# Patient Record
Sex: Male | Born: 1965 | Race: White | Hispanic: No | Marital: Married | Smoking: Never smoker
Health system: Southern US, Community
[De-identification: ages and names within clinical notes are randomized; demographics above are authoritative.]

---

## 2015-11-11 ENCOUNTER — Emergency Department (HOSPITAL_COMMUNITY): Payer: BLUE CROSS/BLUE SHIELD | Admitting: Anesthesiology

## 2015-11-11 ENCOUNTER — Encounter (HOSPITAL_COMMUNITY)
Admission: EM | Disposition: A | Discharge status: Home / Self Care | Payer: Self-pay | Source: Home / Self Care | Attending: Emergency Medicine

## 2015-11-11 ENCOUNTER — Encounter (HOSPITAL_COMMUNITY): Payer: Self-pay | Admitting: *Deleted

## 2015-11-11 ENCOUNTER — Observation Stay (HOSPITAL_COMMUNITY)
Admission: EM | Admit: 2015-11-11 | Discharge: 2015-11-12 | Disposition: A | Discharge status: Home / Self Care | Payer: BLUE CROSS/BLUE SHIELD | Attending: Surgery | Admitting: Surgery

## 2015-11-11 ENCOUNTER — Emergency Department (HOSPITAL_COMMUNITY): Payer: BLUE CROSS/BLUE SHIELD

## 2015-11-11 DIAGNOSIS — K353 Acute appendicitis with localized peritonitis, without perforation or gangrene: Secondary | ICD-10-CM

## 2015-11-11 DIAGNOSIS — K3580 Unspecified acute appendicitis: Secondary | ICD-10-CM | POA: Diagnosis present

## 2015-11-11 DIAGNOSIS — R109 Unspecified abdominal pain: Secondary | ICD-10-CM | POA: Diagnosis present

## 2015-11-11 HISTORY — PX: LAPAROSCOPIC APPENDECTOMY: SHX408

## 2015-11-11 LAB — URINALYSIS, ROUTINE W REFLEX MICROSCOPIC
BILIRUBIN URINE: NEGATIVE
Glucose, UA: NEGATIVE mg/dL
HGB URINE DIPSTICK: NEGATIVE
Ketones, ur: NEGATIVE mg/dL
Leukocytes, UA: NEGATIVE
Nitrite: NEGATIVE
PH: 5.5 (ref 5.0–8.0)
Protein, ur: NEGATIVE mg/dL
SPECIFIC GRAVITY, URINE: 1.024 (ref 1.005–1.030)

## 2015-11-11 LAB — CBC
HCT: 42.7 % (ref 39.0–52.0)
Hemoglobin: 14.3 g/dL (ref 13.0–17.0)
MCH: 28.9 pg (ref 26.0–34.0)
MCHC: 33.5 g/dL (ref 30.0–36.0)
MCV: 86.3 fL (ref 78.0–100.0)
Platelets: 241 10*3/uL (ref 150–400)
RBC: 4.95 MIL/uL (ref 4.22–5.81)
RDW: 13.9 % (ref 11.5–15.5)
WBC: 10 10*3/uL (ref 4.0–10.5)

## 2015-11-11 LAB — COMPREHENSIVE METABOLIC PANEL
ALBUMIN: 4.2 g/dL (ref 3.5–5.0)
ALT: 17 U/L (ref 17–63)
ANION GAP: 8 (ref 5–15)
AST: 17 U/L (ref 15–41)
Alkaline Phosphatase: 41 U/L (ref 38–126)
BUN: 19 mg/dL (ref 6–20)
CO2: 28 mmol/L (ref 22–32)
CREATININE: 0.9 mg/dL (ref 0.61–1.24)
Calcium: 9.6 mg/dL (ref 8.9–10.3)
Chloride: 104 mmol/L (ref 101–111)
GFR calc Af Amer: >60 mL/min (ref >60)
GFR calc non Af Amer: >60 mL/min (ref >60)
GLUCOSE: 111 mg/dL — AB (ref 65–99)
Potassium: 4 mmol/L (ref 3.5–5.1)
Sodium: 140 mmol/L (ref 135–145)
TOTAL PROTEIN: 7.1 g/dL (ref 6.5–8.1)
Total Bilirubin: 0.7 mg/dL (ref 0.3–1.2)

## 2015-11-11 LAB — LIPASE, BLOOD: Lipase: 31 U/L (ref 11–51)

## 2015-11-11 SURGERY — APPENDECTOMY, LAPAROSCOPIC
Anesthesia: General | Site: Abdomen

## 2015-11-11 MED ORDER — MIDAZOLAM HCL 5 MG/5ML IJ SOLN
INTRAMUSCULAR | Status: DC | PRN
  Administered 2015-11-11: 2 mg via INTRAVENOUS

## 2015-11-11 MED ORDER — DEXTROSE 5 % IV SOLN: 2.0000 g | INTRAVENOUS | Status: DC

## 2015-11-11 MED ORDER — DIPHENHYDRAMINE HCL 50 MG/ML IJ SOLN: 25.0000 mg | Freq: Four times a day (QID) | INTRAMUSCULAR | Status: DC | PRN

## 2015-11-11 MED ORDER — ROCURONIUM BROMIDE 100 MG/10ML IV SOLN
INTRAVENOUS | Status: DC | PRN
  Administered 2015-11-11: 35 mg via INTRAVENOUS

## 2015-11-11 MED ORDER — ACETAMINOPHEN 650 MG RE SUPP: 650.0000 mg | Freq: Four times a day (QID) | RECTAL | Status: DC | PRN

## 2015-11-11 MED ORDER — DEXAMETHASONE SODIUM PHOSPHATE 10 MG/ML IJ SOLN
INTRAMUSCULAR | Status: DC | PRN
  Administered 2015-11-11: 10 mg via INTRAVENOUS

## 2015-11-11 MED ORDER — MIDAZOLAM HCL 2 MG/2ML IJ SOLN
INTRAMUSCULAR | Status: AC
  Filled 2015-11-11: qty 2

## 2015-11-11 MED ORDER — MEPERIDINE HCL 50 MG/ML IJ SOLN: 6.2500 mg | INTRAMUSCULAR | Status: DC | PRN

## 2015-11-11 MED ORDER — HYDROMORPHONE HCL 1 MG/ML IJ SOLN: 1.0000 mg | INTRAMUSCULAR | Status: DC | PRN

## 2015-11-11 MED ORDER — ONDANSETRON HCL 4 MG/2ML IJ SOLN: 4.0000 mg | Freq: Four times a day (QID) | INTRAMUSCULAR | Status: DC | PRN

## 2015-11-11 MED ORDER — LIDOCAINE HCL (CARDIAC) 20 MG/ML IV SOLN
INTRAVENOUS | Status: DC | PRN
  Administered 2015-11-11: 80 mg via INTRAVENOUS

## 2015-11-11 MED ORDER — LACTATED RINGERS IV SOLN
INTRAVENOUS | Status: DC | PRN
  Administered 2015-11-11 (×2): via INTRAVENOUS

## 2015-11-11 MED ORDER — PROPOFOL 10 MG/ML IV BOLUS
INTRAVENOUS | Status: DC | PRN
  Administered 2015-11-11: 190 mg via INTRAVENOUS

## 2015-11-11 MED ORDER — ONDANSETRON 4 MG PO TBDP: 4.0000 mg | ORAL_TABLET | Freq: Four times a day (QID) | ORAL | Status: DC | PRN

## 2015-11-11 MED ORDER — LACTATED RINGERS IR SOLN
Status: DC | PRN
  Administered 2015-11-11: 1000 mL

## 2015-11-11 MED ORDER — BUPIVACAINE-EPINEPHRINE 0.25% -1:200000 IJ SOLN
INTRAMUSCULAR | Status: DC | PRN
  Administered 2015-11-11: 20 mL

## 2015-11-11 MED ORDER — SUGAMMADEX SODIUM 200 MG/2ML IV SOLN
INTRAVENOUS | Status: DC | PRN
  Administered 2015-11-11: 200 mg via INTRAVENOUS

## 2015-11-11 MED ORDER — HYDROCODONE-ACETAMINOPHEN 5-325 MG PO TABS: 1.0000 | ORAL_TABLET | ORAL | Status: DC | PRN

## 2015-11-11 MED ORDER — HYDROMORPHONE HCL 1 MG/ML IJ SOLN
INTRAMUSCULAR | Status: AC
  Filled 2015-11-11: qty 1

## 2015-11-11 MED ORDER — FENTANYL CITRATE (PF) 100 MCG/2ML IJ SOLN
INTRAMUSCULAR | Status: DC | PRN
  Administered 2015-11-11 (×2): 50 ug via INTRAVENOUS
  Administered 2015-11-11: 150 ug via INTRAVENOUS

## 2015-11-11 MED ORDER — ACETAMINOPHEN 325 MG PO TABS: 650.0000 mg | ORAL_TABLET | Freq: Four times a day (QID) | ORAL | Status: DC | PRN

## 2015-11-11 MED ORDER — MORPHINE SULFATE (PF) 2 MG/ML IV SOLN: 2.0000 mg | INTRAVENOUS | Status: DC | PRN

## 2015-11-11 MED ORDER — PROPOFOL 10 MG/ML IV BOLUS
INTRAVENOUS | Status: AC
  Filled 2015-11-11: qty 20

## 2015-11-11 MED ORDER — BUPIVACAINE-EPINEPHRINE (PF) 0.25% -1:200000 IJ SOLN
INTRAMUSCULAR | Status: AC
  Filled 2015-11-11: qty 30

## 2015-11-11 MED ORDER — DEXAMETHASONE SODIUM PHOSPHATE 10 MG/ML IJ SOLN
INTRAMUSCULAR | Status: AC
  Filled 2015-11-11: qty 1

## 2015-11-11 MED ORDER — 0.9 % SODIUM CHLORIDE (POUR BTL) OPTIME
TOPICAL | Status: DC | PRN
  Administered 2015-11-11: 1000 mL

## 2015-11-11 MED ORDER — GLYCOPYRROLATE 0.2 MG/ML IJ SOLN
INTRAMUSCULAR | Status: DC | PRN
  Administered 2015-11-11: 0.2 mg via INTRAVENOUS

## 2015-11-11 MED ORDER — CEFOXITIN SODIUM 2 G IV SOLR
2.0000 g | Freq: Once | INTRAVENOUS | Status: AC
  Administered 2015-11-11: 2 g via INTRAVENOUS
  Filled 2015-11-11: qty 2

## 2015-11-11 MED ORDER — LACTATED RINGERS IV SOLN: INTRAVENOUS | Status: DC

## 2015-11-11 MED ORDER — IOHEXOL 300 MG/ML  SOLN
25.0000 mL | Freq: Once | INTRAMUSCULAR | Status: AC | PRN
  Administered 2015-11-11: 25 mL via ORAL

## 2015-11-11 MED ORDER — SUGAMMADEX SODIUM 200 MG/2ML IV SOLN
INTRAVENOUS | Status: AC
  Filled 2015-11-11: qty 2

## 2015-11-11 MED ORDER — ONDANSETRON HCL 4 MG/2ML IJ SOLN
4.0000 mg | Freq: Four times a day (QID) | INTRAMUSCULAR | Status: DC | PRN
Start: 2015-11-11 — End: 2015-11-12

## 2015-11-11 MED ORDER — POTASSIUM CHLORIDE IN NACL 20-0.9 MEQ/L-% IV SOLN
INTRAVENOUS | Status: DC
  Filled 2015-11-11 (×2): qty 1000

## 2015-11-11 MED ORDER — DIPHENHYDRAMINE HCL 25 MG PO CAPS: 25.0000 mg | ORAL_CAPSULE | Freq: Four times a day (QID) | ORAL | Status: DC | PRN

## 2015-11-11 MED ORDER — HYDROMORPHONE HCL 1 MG/ML IJ SOLN
0.2500 mg | INTRAMUSCULAR | Status: DC | PRN
  Administered 2015-11-11: 0.5 mg via INTRAVENOUS

## 2015-11-11 MED ORDER — DEXTROSE 5 % IV SOLN
1.0000 g | Freq: Three times a day (TID) | INTRAVENOUS | Status: DC
  Administered 2015-11-11 – 2015-11-12 (×2): 1 g via INTRAVENOUS
  Filled 2015-11-11 (×4): qty 1

## 2015-11-11 MED ORDER — KCL IN DEXTROSE-NACL 20-5-0.45 MEQ/L-%-% IV SOLN
INTRAVENOUS | Status: DC
  Filled 2015-11-11 (×2): qty 1000

## 2015-11-11 MED ORDER — FENTANYL CITRATE (PF) 250 MCG/5ML IJ SOLN
INTRAMUSCULAR | Status: AC
  Filled 2015-11-11: qty 5

## 2015-11-11 MED ORDER — PROMETHAZINE HCL 25 MG/ML IJ SOLN: 6.2500 mg | INTRAMUSCULAR | Status: DC | PRN

## 2015-11-11 MED ORDER — SODIUM CHLORIDE 0.9 % IV BOLUS (SEPSIS)
500.0000 mL | Freq: Once | INTRAVENOUS | Status: AC
  Administered 2015-11-11: 500 mL via INTRAVENOUS

## 2015-11-11 MED ORDER — ONDANSETRON HCL 4 MG/2ML IJ SOLN
INTRAMUSCULAR | Status: DC | PRN
  Administered 2015-11-11: 4 mg via INTRAVENOUS

## 2015-11-11 MED ORDER — EPHEDRINE SULFATE 50 MG/ML IJ SOLN
INTRAMUSCULAR | Status: DC | PRN
  Administered 2015-11-11: 10 mg via INTRAVENOUS

## 2015-11-11 MED ORDER — ONDANSETRON HCL 4 MG/2ML IJ SOLN
INTRAMUSCULAR | Status: AC
  Filled 2015-11-11: qty 2

## 2015-11-11 MED ORDER — SUCCINYLCHOLINE CHLORIDE 20 MG/ML IJ SOLN
INTRAMUSCULAR | Status: DC | PRN
  Administered 2015-11-11: 100 mg via INTRAVENOUS

## 2015-11-11 MED ORDER — METRONIDAZOLE IN NACL 5-0.79 MG/ML-% IV SOLN
500.0000 mg | Freq: Three times a day (TID) | INTRAVENOUS | Status: DC
Start: 2015-11-11 — End: 2015-11-12
  Administered 2015-11-11 – 2015-11-12 (×2): 500 mg via INTRAVENOUS
  Filled 2015-11-11 (×3): qty 100

## 2015-11-11 MED ORDER — IOPAMIDOL (ISOVUE-300) INJECTION 61%
100.0000 mL | Freq: Once | INTRAVENOUS | Status: AC | PRN
  Administered 2015-11-11: 100 mL via INTRAVENOUS

## 2015-11-11 SURGICAL SUPPLY — 30 items
APPLIER CLIP ROT 10 11.4 M/L (STAPLE)
BENZOIN TINCTURE PRP APPL 2/3 (GAUZE/BANDAGES/DRESSINGS) ×3 IMPLANT
CLIP APPLIE ROT 10 11.4 M/L (STAPLE) IMPLANT
CLOSURE WOUND 1/2 X4 (GAUZE/BANDAGES/DRESSINGS) ×1
COVER SURGICAL LIGHT HANDLE (MISCELLANEOUS) ×3 IMPLANT
CUTTER FLEX LINEAR 45M (STAPLE) ×3 IMPLANT
DECANTER SPIKE VIAL GLASS SM (MISCELLANEOUS) IMPLANT
DRAPE LAPAROSCOPIC ABDOMINAL (DRAPES) ×3 IMPLANT
ELECT REM PT RETURN 9FT ADLT (ELECTROSURGICAL) ×3
ELECTRODE REM PT RTRN 9FT ADLT (ELECTROSURGICAL) ×1 IMPLANT
ENDOLOOP SUT PDS II  0 18 (SUTURE)
ENDOLOOP SUT PDS II 0 18 (SUTURE) IMPLANT
GLOVE SURG ORTHO 8.0 STRL STRW (GLOVE) ×3 IMPLANT
GOWN STRL REUS W/TWL XL LVL3 (GOWN DISPOSABLE) ×6 IMPLANT
KIT BASIN OR (CUSTOM PROCEDURE TRAY) ×3 IMPLANT
POUCH SPECIMEN RETRIEVAL 10MM (ENDOMECHANICALS) ×3 IMPLANT
RELOAD 45 VASCULAR/THIN (ENDOMECHANICALS) IMPLANT
RELOAD STAPLE TA45 3.5 REG BLU (ENDOMECHANICALS) ×3 IMPLANT
SET IRRIG TUBING LAPAROSCOPIC (IRRIGATION / IRRIGATOR) ×3 IMPLANT
SHEARS HARMONIC ACE PLUS 36CM (ENDOMECHANICALS) ×3 IMPLANT
STRIP CLOSURE SKIN 1/2X4 (GAUZE/BANDAGES/DRESSINGS) ×2 IMPLANT
SUT MNCRL AB 4-0 PS2 18 (SUTURE) ×3 IMPLANT
TOWEL OR 17X26 10 PK STRL BLUE (TOWEL DISPOSABLE) ×3 IMPLANT
TOWEL OR NON WOVEN STRL DISP B (DISPOSABLE) ×3 IMPLANT
TRAY FOLEY W/METER SILVER 14FR (SET/KITS/TRAYS/PACK) ×3 IMPLANT
TRAY FOLEY W/METER SILVER 16FR (SET/KITS/TRAYS/PACK) ×3 IMPLANT
TRAY LAPAROSCOPIC (CUSTOM PROCEDURE TRAY) ×3 IMPLANT
TROCAR BLADELESS OPT 5 75 (ENDOMECHANICALS) ×3 IMPLANT
TROCAR XCEL BLUNT TIP 100MML (ENDOMECHANICALS) ×3 IMPLANT
TROCAR XCEL NON-BLD 11X100MML (ENDOMECHANICALS) ×3 IMPLANT

## 2015-11-11 NOTE — ED Notes (Signed)
Pt states that he began having RLQ abd pain that began Saturday; pt states that the pain has gotten progressively worse sine Sat and that the pain woke him from sleep this am; pt states intermittent nausea, denies nausea currently; pt denies vomiting and diarrhea

## 2015-11-11 NOTE — Discharge Instructions (Signed)
Laparoscopic Appendectomy, Adult, Care After °Refer to this sheet in the next few weeks. These instructions provide you with information on caring for yourself after your procedure. Your caregiver may also give you more specific instructions. Your treatment has been planned according to current medical practices, but problems sometimes occur. Call your caregiver if you have any problems or questions after your procedure. °HOME CARE INSTRUCTIONS °· Do not drive while taking narcotic pain medicines. °· Use stool softener if you become constipated from your pain medicines. °· Change your bandages (dressings) as directed. °· Keep your wounds clean and dry. You may wash the wounds gently with soap and water. Gently pat the wounds dry with a clean towel. °· Do not take baths, swim, or use hot tubs for 10 days, or as instructed by your caregiver. °· Only take over-the-counter or prescription medicines for pain, discomfort, or fever as directed by your caregiver. °· You may continue your normal diet as directed. °· Do not lift more than 10 pounds (4.5 kg) or play contact sports for 3 weeks, or as directed. °· Slowly increase your activity after surgery. °· Take deep breaths to avoid getting a lung infection (pneumonia). °SEEK MEDICAL CARE IF: °· You have redness, swelling, or increasing pain in your wounds. °· You have pus coming from your wounds. °· You have drainage from a wound that lasts longer than 1 day. °· You notice a bad smell coming from the wounds or dressing. °· Your wound edges break open after stitches (sutures) have been removed. °· You notice increasing pain in the shoulders (shoulder strap areas) or near your shoulder blades. °· You develop dizzy episodes or fainting while standing. °· You develop shortness of breath. °· You develop persistent nausea or vomiting. °· You cannot control your bowel functions or lose your appetite. °· You develop diarrhea. °SEEK IMMEDIATE MEDICAL CARE IF:  °· You have a  fever. °· You develop a rash. °· You have difficulty breathing or sharp pains in your chest. °· You develop any reaction or side effects to medicines given. °MAKE SURE YOU: °· Understand these instructions. °· Will watch your condition. °· Will get help right away if you are not doing well or get worse. °  °This information is not intended to replace advice given to you by your health care provider. Make sure you discuss any questions you have with your health care provider. °  °Document Released: 08/09/2005 Document Revised: 12/24/2014 Document Reviewed: 01/27/2015 °Elsevier Interactive Patient Education ©2016 [employer] ° °CCS ______[facility] °LAPAROSCOPIC SURGERY: POST OP INSTRUCTIONS °Always review your discharge instruction sheet given to you by the facility where your surgery was performed. °IF YOU HAVE DISABILITY OR FAMILY LEAVE FORMS, YOU MUST BRING THEM TO THE OFFICE FOR PROCESSING.   °DO NOT GIVE THEM TO YOUR DOCTOR. ° °1. A prescription for pain medication may be given to you upon discharge.  Take your pain medication as prescribed, if needed.  If narcotic pain medicine is not needed, then you may take acetaminophen (Tylenol) or ibuprofen (Advil) as needed. °2. Take your usually prescribed medications unless otherwise directed. °3. If you need a refill on your pain medication, please contact your pharmacy.  They will contact our office to request authorization. Prescriptions will not be filled after 5pm or on week-ends. °4. You should follow a light diet the first few days after arrival home, such as soup and crackers, etc.  Be sure to include lots of fluids daily. °5. Most   patients will experience some swelling and bruising in the area of the incisions.  Ice packs will help.  Swelling and bruising can take several days to resolve.  °6. It is common to experience some constipation if taking pain medication after surgery.  Increasing fluid intake and taking a stool softener (such as  Colace) will usually help or prevent this problem from occurring.  A mild laxative (Milk of Magnesia or Miralax) should be taken according to package instructions if there are no bowel movements after 48 hours. °7. Unless discharge instructions indicate otherwise, you may remove your bandages 24-48 hours after surgery, and you may shower at that time.  You may have steri-strips (small skin tapes) in place directly over the incision.  These strips should be left on the skin for 7-10 days.  If your surgeon used skin glue on the incision, you may shower in 24 hours.  The glue will flake off over the next 2-3 weeks.  Any sutures or staples will be removed at the office during your follow-up visit. °8. ACTIVITIES:  You may resume regular (light) daily activities beginning the next day--such as daily self-care, walking, climbing stairs--gradually increasing activities as tolerated.  You may have sexual intercourse when it is comfortable.  Refrain from any heavy lifting or straining until approved by your doctor. °a. You may drive when you are no longer taking prescription pain medication, you can comfortably wear a seatbelt, and you can safely maneuver your car and apply brakes. °b. RETURN TO WORK:  __________________________________________________________ °9. You should see your doctor in the office for a follow-up appointment approximately 2-3 weeks after your surgery.  Make sure that you call for this appointment within a day or two after you arrive home to insure a convenient appointment time. °10. OTHER INSTRUCTIONS: __________________________________________________________________________________________________________________________ __________________________________________________________________________________________________________________________ °WHEN TO CALL YOUR DOCTOR: °1. Fever over 101.0 °2. Inability to urinate °3. Continued bleeding from incision. °4. Increased pain, redness, or drainage from the  incision. °5. Increasing abdominal pain ° °The clinic staff is available to answer your questions during regular business hours.  Please don’t hesitate to call and ask to speak to one of the nurses for clinical concerns.  If you have a medical emergency, go to the nearest emergency room or call 911.  A surgeon from [facility] is always on call at the hospital. °[address], [address], [place], [place]  [address] ? [address] [address], [place], [place]   [address] °[phone] ? [phone] ? FAX [phone] °Web site: [email or web address] ° °

## 2015-11-11 NOTE — ED Notes (Addendum)
Wife at bedside taking all patient's belongings home

## 2015-11-11 NOTE — ED Notes (Signed)
MD at bedside. 

## 2015-11-11 NOTE — ED Provider Notes (Signed)
CSN: [id number]     Arrival date & time 11/11/15  [id number] History   First MD Initiated Contact with Patient 11/11/15 0825     Chief Complaint  Patient presents with  . Abdominal Pain     Patient is a 50 y.o. male presenting with abdominal pain. The history is provided by the patient.  Abdominal Pain Associated symptoms: nausea   Associated symptoms: no constipation, no fatigue, no fever, no hematuria and no vomiting   Patient presents with right lower abdominal pain. Began around 4 days ago. It is dull. No fevers. No constipation. No relief with bowel movements. May be slightly worse with eating. No dysuria. It woke him up last night due to the pain. No fevers or chills. He has not had pain like this before. No radiation.  History reviewed. No pertinent past medical history. History reviewed. No pertinent past surgical history. History reviewed. No pertinent family history. Social History  Substance Use Topics  . Smoking status: Never Smoker   . Smokeless tobacco: Never Used  . Alcohol Use: Yes     Comment: socially    Review of Systems  Constitutional: Positive for appetite change. Negative for fever and fatigue.  Gastrointestinal: Positive for nausea and abdominal pain. Negative for vomiting and constipation.  Genitourinary: Negative for hematuria, discharge and enuresis.  Musculoskeletal: Negative for back pain.  Skin: Negative for rash.  Neurological: Negative for light-headedness.      Allergies  Review of patient's allergies indicates no known allergies.  Home Medications   Prior to Admission medications   Medication Sig Start Date End Date Taking? Authorizing Provider  Ascorbic Acid (VITAMIN C PO) Take 1 tablet by mouth 2 (two) times daily as needed (cold symptoms).   Yes Historical Provider, MD  ECHINACEA PO Take 1 capsule by mouth 2 (two) times daily as needed (for cold symptoms).   Yes Historical Provider, MD   BP 141/69 mmHg  Pulse 58  Temp(Src) 97.7 F (36.5  C) (Oral)  Resp 14  Ht 6\' 5"  (1.956 m)  Wt 195 lb (88.451 kg)  BMI 23.12 kg/m2  SpO2 100% Physical Exam  Constitutional: He appears well-developed.  Patient is 6 foot 5 inches tall  HENT:  Head: Normocephalic and atraumatic.  Neck: Neck supple.  Cardiovascular: Normal rate.   No murmur heard. Pulmonary/Chest: Effort normal.  Abdominal: Soft. There is tenderness.  Things somewhat right lower quadrant tenderness without rebound or guarding.   Musculoskeletal: He exhibits no edema.  Neurological: He is alert.  Skin: Skin is warm.  Psychiatric: He has a normal mood and affect.    ED Course  Procedures (including critical care time) Labs Review Labs Reviewed  COMPREHENSIVE METABOLIC PANEL - Abnormal; Notable for the following:    Glucose, Bld 111 (*)    All other components within normal limits  LIPASE, BLOOD  CBC  URINALYSIS, ROUTINE W REFLEX MICROSCOPIC ([facility])  SURGICAL PATHOLOGY    Imaging Review Ct Abdomen Pelvis W Contrast  11/11/2015  CLINICAL DATA:  Acute right lower quadrant pain since Saturday. Associated nausea. EXAM: CT ABDOMEN AND PELVIS WITH CONTRAST TECHNIQUE: Multidetector CT imaging of the abdomen and pelvis was performed using the standard protocol following bolus administration of intravenous contrast. CONTRAST:  100mL ISOVUE-300 IOPAMIDOL (ISOVUE-300) INJECTION 61% COMPARISON:  None. FINDINGS: Lower chest:  No acute findings. Hepatobiliary: Incidental posterior right hepatic subcapsular cyst measures 10 mm, image 19. No biliary dilatation. Patent hepatic and portal veins. No other significant hepatic abnormality. Gallbladder  biliary system within normal limits. Pancreas: No mass, inflammatory changes, or other significant abnormality. Spleen: Within normal limits in size and appearance. Adrenals/Urinary Tract: No masses identified. No evidence of hydronephrosis. Stomach/Bowel: Negative for bowel obstruction, significant dilatation, ileus, or free air. In  the right lower quadrant, the appendix is inflamed, fluid distended and contains an appendicolith. Appearance is compatible with a non ruptured acute appendicitis. No fluid collection or abscess. Vascular/Lymphatic: No pathologically enlarged lymph nodes. No evidence of abdominal aortic aneurysm. Reproductive: No mass or other significant abnormality. Other: Trace dependent pelvic free fluid, suspect related to the appendicitis. Musculoskeletal:  No suspicious bone lesions identified. IMPRESSION: Acute nonruptured appendicitis. These results were called by telephone at the time of interpretation on 11/11/2015 at 10:28 am to Dr. Benjiman Core , who verbally acknowledged these results. Electronically Signed   By: Judie Petit.  Shick M.D.   On: 11/11/2015 10:28   I have personally reviewed and evaluated these images and lab results as part of my medical decision-making.   EKG Interpretation None      MDM   Final diagnoses:  Acute appendicitis with localized peritonitis    Patient abdominal pain. Labs reassuring. Found to have acute appendicitis on CT. Admit to general surgery.    Benjiman Core, MD 11/11/15 [id number]

## 2015-11-11 NOTE — H&P (Signed)
Ronald Boyd is an 50 y.o. male.    Chief Complaint: Abdominal pain  HPI: Pt reports having RLQ pain that started Saturday 11/08/15, thought it might be constipation. Continued to persist, thru Sunday 3/19, but he played golf and did fine.  Monday  He still had pain, but went to work, ate no real issues, some nauesa after lunch.  Ate supper, and went to bed.  Woke up with more significant pain and came to ED this AM.  Pain comes and goes, mostly riight lower quadrant, but some going into the mid and left lower abdomen.   Ongoing intermittent nausea, vomiting and diarrhea.  Work up in the ED show: afebrile, VSS, labs are normal.  CT scan:  In the right lower quadrant, the appendix is inflamed, fluid distended and contains an appendicolith. Appearance is compatible with a non ruptured acute appendicitis. No fluid collection or abscess.  We are ask to see.  History reviewed. No pertinent past medical history.  History reviewed. No pertinent past surgical history.  No family history on file. Social History:  reports that he has never smoked. He does not have any smokeless tobacco history on file. He reports that he drinks alcohol. He reports that he does not use illicit drugs. 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 Authorizing Provider  Ascorbic Acid (VITAMIN C PO) Take 1 tablet by mouth 2 (two) times daily as needed (cold symptoms).   Yes Historical Provider, MD  ECHINACEA PO Take 1 capsule by mouth 2 (two) times daily as needed (for cold symptoms).   Yes Historical Provider, MD     Results for orders placed or performed during the hospital encounter of 11/11/15 (from the past 48 hour(s))  Lipase, blood     Status: None   Collection Time: 11/11/15  6:36 AM  Result Value Ref Range   Lipase 31 11 - 51 U/L  Comprehensive metabolic panel     Status:  Abnormal   Collection Time: 11/11/15  6:36 AM  Result Value Ref Range   Sodium 140 135 - 145 mmol/L   Potassium 4.0 3.5 - 5.1 mmol/L   Chloride 104 101 - 111 mmol/L   CO2 28 22 - 32 mmol/L   Glucose, Bld 111 (H) 65 - 99 mg/dL   BUN 19 6 - 20 mg/dL   Creatinine, Ser 0.90 0.61 - 1.24 mg/dL   Calcium 9.6 8.9 - 10.3 mg/dL   Total Protein 7.1 6.5 - 8.1 g/dL   Albumin 4.2 3.5 - 5.0 g/dL   AST 17 15 - 41 U/L   ALT 17 17 - 63 U/L   Alkaline Phosphatase 41 38 - 126 U/L   Total Bilirubin 0.7 0.3 - 1.2 mg/dL   GFR calc non Af Amer >60 >60 mL/min   GFR calc Af Amer >60 >60 mL/min    Comment: (NOTE) The eGFR has been calculated using the CKD EPI equation. This calculation has not been validated in all clinical situations. eGFR's persistently <60 mL/min signify possible Chronic Kidney Disease.    Anion gap 8 5 - 15  CBC     Status: None   Collection Time: 11/11/15  6:36 AM  Result Value Ref Range   WBC 10.0 4.0 - 10.5 K/uL   RBC 4.95 4.22 - 5.81 MIL/uL   Hemoglobin 14.3 13.0 - 17.0 g/dL   HCT  42.7 39.0 - 52.0 %   MCV 86.3 78.0 - 100.0 fL   MCH 28.9 26.0 - 34.0 pg   MCHC 33.5 30.0 - 36.0 g/dL   RDW 13.9 11.5 - 15.5 %   Platelets 241 150 - 400 K/uL  Urinalysis, Routine w reflex microscopic (not at [facility])     Status: None   Collection Time: 11/11/15  8:37 AM  Result Value Ref Range   Color, Urine YELLOW YELLOW   APPearance CLEAR CLEAR   Specific Gravity, Urine 1.024 1.005 - 1.030   pH 5.5 5.0 - 8.0   Glucose, UA NEGATIVE NEGATIVE mg/dL   Hgb urine dipstick NEGATIVE NEGATIVE   Bilirubin Urine NEGATIVE NEGATIVE   Ketones, ur NEGATIVE NEGATIVE mg/dL   Protein, ur NEGATIVE NEGATIVE mg/dL   Nitrite NEGATIVE NEGATIVE   Leukocytes, UA NEGATIVE NEGATIVE    Comment: MICROSCOPIC NOT DONE ON URINES WITH NEGATIVE PROTEIN, BLOOD, LEUKOCYTES, NITRITE, OR GLUCOSE <1000 mg/dL.   Ct Abdomen Pelvis W Contrast  11/11/2015  CLINICAL DATA:  Acute right lower quadrant pain since Saturday. Associated  nausea. EXAM: CT ABDOMEN AND PELVIS WITH CONTRAST TECHNIQUE: Multidetector CT imaging of the abdomen and pelvis was performed using the standard protocol following bolus administration of intravenous contrast. CONTRAST:  179m ISOVUE-300 IOPAMIDOL (ISOVUE-300) INJECTION 61% COMPARISON:  None. FINDINGS: Lower chest:  No acute findings. Hepatobiliary: Incidental posterior right hepatic subcapsular cyst measures 10 mm, image 19. No biliary dilatation. Patent hepatic and portal veins. No other significant hepatic abnormality. Gallbladder biliary system within normal limits. Pancreas: No mass, inflammatory changes, or other significant abnormality. Spleen: Within normal limits in size and appearance. Adrenals/Urinary Tract: No masses identified. No evidence of hydronephrosis. Stomach/Bowel: Negative for bowel obstruction, significant dilatation, ileus, or free air. In the right lower quadrant, the appendix is inflamed, fluid distended and contains an appendicolith. Appearance is compatible with a non ruptured acute appendicitis. No fluid collection or abscess. Vascular/Lymphatic: No pathologically enlarged lymph nodes. No evidence of abdominal aortic aneurysm. Reproductive: No mass or other significant abnormality. Other: Trace dependent pelvic free fluid, suspect related to the appendicitis. Musculoskeletal:  No suspicious bone lesions identified. IMPRESSION: Acute nonruptured appendicitis. These results were called by telephone at the time of interpretation on 11/11/2015 at 10:28 am to Dr. NDavonna Belling, who verbally acknowledged these results. Electronically Signed   By: MJerilynn Mages  Shick M.D.   On: 11/11/2015 10:28    Review of Systems  Constitutional: Negative.   HENT: Negative.   Eyes: Negative.   Respiratory: Negative.   Cardiovascular: Negative.   Gastrointestinal: Positive for heartburn, nausea and abdominal pain (Mostly RLQ, sometimes into mid and left lower side.  ). Negative for vomiting, diarrhea,  constipation, blood in stool and melena.  Genitourinary: Negative.   Musculoskeletal: Negative.   Skin: Negative.   Neurological: Negative.   Endo/Heme/Allergies: Negative.   Psychiatric/Behavioral: Negative.     Blood pressure 153/90, pulse 65, temperature 97.9 F (36.6 C), temperature source Oral, resp. rate 18, height '6\' 5"'$  (1.956 m), weight 88.451 kg (195 lb), SpO2 100 %. Physical Exam  Constitutional: He is oriented to person, place, and time. He appears well-developed and well-nourished. No distress.  HENT:  Head: Normocephalic and atraumatic.  Mouth/Throat: No oropharyngeal exudate.  Eyes: EOM are normal. Right eye exhibits no discharge. Left eye exhibits no discharge. No scleral icterus.  Neck: Normal range of motion. Neck supple. No JVD present. No tracheal deviation present. No thyromegaly present.  Cardiovascular: Normal rate, regular rhythm, normal heart sounds  and intact distal pulses.   No murmur heard. Respiratory: Effort normal and breath sounds normal. No respiratory distress. He has no wheezes. He has no rales. He exhibits no tenderness.  GI: Soft. Bowel sounds are normal. He exhibits no distension and no mass. There is tenderness (he has some but not much tenderness RLQ.). There is no rebound and no guarding.  Musculoskeletal: He exhibits no edema or tenderness.  Lymphadenopathy:    He has no cervical adenopathy.  Neurological: He is alert and oriented to person, place, and time. No cranial nerve deficit.  Skin: Skin is warm and dry. No rash noted. He is not diaphoretic. No erythema. No pallor.  Psychiatric: He has a normal mood and affect. His behavior is normal. Judgment and thought content normal.     Assessment/Plan Acute appendicitis   Plan:  Surgery this afternoon, IV antibiotics, hydration, admit to observation.  Risk and benefits discussed and pt is agreeable.    Murat Rideout, PA-C 11/11/2015, 10:49 AM

## 2015-11-11 NOTE — Anesthesia Postprocedure Evaluation (Signed)
Anesthesia Post Note  Patient: Ronald DawesJames Kisiel  Procedure(s) Performed: Procedure(s) (LRB): APPENDECTOMY LAPAROSCOPIC (N/A)  Patient location during evaluation: PACU Anesthesia Type: General Level of consciousness: awake and alert Pain management: pain level controlled Vital Signs Assessment: post-procedure vital signs reviewed and stable Respiratory status: spontaneous breathing, nonlabored ventilation, respiratory function stable and patient connected to nasal cannula oxygen Cardiovascular status: blood pressure returned to baseline and stable Postop Assessment: no signs of nausea or vomiting Anesthetic complications: no    Last Vitals:  Filed Vitals:   11/11/15 1108 11/11/15 1345  BP: 131/84 160/92  Pulse: 66 79  Temp:  36.4 C  Resp: 16 11    Last Pain:  Filed Vitals:   11/11/15 1349  PainSc: 2                  Shelton SilvasKevin D Lanise Mergen

## 2015-11-11 NOTE — Anesthesia Preprocedure Evaluation (Addendum)
Anesthesia Evaluation  Patient identified by MRN, date of birth, ID band Patient awake    Reviewed: Allergy & Precautions, NPO status , Patient's Chart, lab work & pertinent test results  Airway Mallampati: I  TM Distance: >3 FB Neck ROM: Full    Dental  (+) Teeth Intact   Pulmonary neg pulmonary ROS,    breath sounds clear to auscultation       Cardiovascular negative cardio ROS   Rhythm:Regular Rate:Normal     Neuro/Psych negative neurological ROS  negative psych ROS   GI/Hepatic negative GI ROS, Neg liver ROS,   Endo/Other  negative endocrine ROS  Renal/GU negative Renal ROS  negative genitourinary   Musculoskeletal negative musculoskeletal ROS (+)   Abdominal Normal abdominal exam  (+)   Peds negative pediatric ROS (+)  Hematology negative hematology ROS (+)   Anesthesia Other Findings   Reproductive/Obstetrics negative OB ROS                            Lab Results  Component Value Date   WBC 10.0 11/11/2015   HGB 14.3 11/11/2015   HCT 42.7 11/11/2015   MCV 86.3 11/11/2015   PLT 241 11/11/2015   No results found for: INR, PROTIME    Anesthesia Physical Anesthesia Plan  ASA: II  Anesthesia Plan: General   Post-op Pain Management:    Induction: Intravenous  Airway Management Planned: Oral ETT  Additional Equipment:   Intra-op Plan:   Post-operative Plan: Extubation in OR  Informed Consent: I have reviewed the patients History and Physical, chart, labs and discussed the procedure including the risks, benefits and alternatives for the proposed anesthesia with the patient or authorized representative who has indicated his/her understanding and acceptance.   Dental advisory given  Plan Discussed with: CRNA  Anesthesia Plan Comments:         Anesthesia Quick Evaluation

## 2015-11-11 NOTE — Anesthesia Procedure Notes (Signed)
Procedure Name: Intubation Date/Time: 11/11/2015 12:59 PM Performed by: Trumaine Wimer, Nuala AlphaKRISTOPHER Pre-anesthesia Checklist: Patient identified, Emergency Drugs available, Suction available and Patient being monitored Patient Re-evaluated:Patient Re-evaluated prior to inductionOxygen Delivery Method: Circle System Utilized Preoxygenation: Pre-oxygenation with 100% oxygen Intubation Type: IV induction Ventilation: Mask ventilation without difficulty Laryngoscope Size: Mac and 3 Grade View: Grade II Tube type: Oral Tube size: 7.5 mm Number of attempts: 1 Airway Equipment and Method: Stylet Placement Confirmation: ETT inserted through vocal cords under direct vision,  positive ETCO2 and breath sounds checked- equal and bilateral Secured at: 23 cm Tube secured with: Tape Dental Injury: Teeth and Oropharynx as per pre-operative assessment

## 2015-11-11 NOTE — Transfer of Care (Signed)
Immediate Anesthesia Transfer of Care Note  Patient: Evonnie DawesJames Durall  Procedure(s) Performed: Procedure(s): APPENDECTOMY LAPAROSCOPIC (N/A)  Patient Location: PACU  Anesthesia Type:General  Level of Consciousness:  sedated, patient cooperative and responds to stimulation  Airway & Oxygen Therapy:Patient Spontanous Breathing and Patient connected to face mask oxgen  Post-op Assessment:  Report given to PACU RN and Post -op Vital signs reviewed and stable  Post vital signs:  Reviewed and stable  Last Vitals:  Filed Vitals:   11/11/15 1108 11/11/15 1345  BP: 131/84 160/92  Pulse: 66 79  Temp:    Resp: 16 11    Complications: No apparent anesthesia complications

## 2015-11-11 NOTE — Op Note (Signed)
OPERATIVE REPORT - LAPAROSCOPIC APPENDECTOMY  Preop diagnosis: Acute appendicitis  Postop diagnosis: Same  Procedure: Laparoscopic appendectomy  Surgeon:  Velora Hecklerodd M. Keyshla Tunison, MD, FACS  Anesthesia: General endotracheal  Estimated blood loss: Minimal  Preparation: Chlora-prep  Complications: None  Indications:  Patient is a 50 yo WM with signs and symptoms of appendicitis.  CTA positive for acute appendicitis with appendicolith but no sign of perforation.  Now for lap appendectomy.  Procedure:  Patient is brought to the operating room and placed in a supine position on the operating room table. Following administration of general anesthesia, a time out was held and the patient's name and procedure is confirmed. Patient is then prepped and draped in the usual strict aseptic fashion.  After ascertaining that an adequate level of anesthesia has been achieved, a peri-umbilical incision is made with a #15 blade. Dissection is carried down to the fascia. Fascia is incised in the midline and the peritoneal cavity is entered cautiously. A #0-vicryl pursestring suture is placed in the fascia. An Hassan cannula is introduced under direct vision and secured with the pursestring suture. The abdomen is insufflated with carbon dioxide. The laparoscope is introduced and the abdomen is explored. Operative ports are placed in the right upper quadrant and left lower quadrant. The appendix is identified. The mesoappendix is divided with the harmonic scalpel. Dissection is carried down to the base of the appendix. The base of the appendix is dissected out clearing the junction with the cecal wall. Using an Endo-GIA stapler, the base of the appendix is transected at the junction with the cecal wall. There is good approximation of tissue along the staple line. There is good hemostasis along the staple line. The appendix is placed into an endo-catch bag and withdrawn through the umbilical port. The #0-vicryl pursestring  suture is tied securely.  Right lower quadrant is irrigated with warm saline which is evacuated. Good hemostasis is noted. Ports are removed under direct vision. Good hemostasis is noted at the port sites. Pneumoperitoneum is released.  Skin incisions are anesthetized with local anesthetic. Wounds are closed with interrupted 4-0 Monocryl subcuticular sutures. Wounds are washed and dried and benzoin and Steri-Strips are applied. Dressings are applied. The patient is awakened from anesthesia and brought to the recovery room. The patient tolerated the procedure well.  Velora Hecklerodd M. Kiyo Heal, MD, [facility] Office: [phone]

## 2015-11-11 NOTE — ED Notes (Signed)
Patient transported to CT 

## 2015-11-12 MED ORDER — IBUPROFEN 200 MG PO TABS: ORAL_TABLET | ORAL | Status: AC

## 2015-11-12 MED ORDER — ACETAMINOPHEN 325 MG PO TABS: 650.0000 mg | ORAL_TABLET | Freq: Four times a day (QID) | ORAL | Status: AC | PRN

## 2015-11-12 MED ORDER — HYDROCODONE-ACETAMINOPHEN 5-325 MG PO TABS
1.0000 | ORAL_TABLET | ORAL | Status: AC | PRN
Start: 2015-11-12 — End: ?

## 2015-11-12 NOTE — Progress Notes (Signed)
1 Day Post-Op  Subjective: He looks great, has not taken anything for pain over night and had a regular breakfast.    Objective: Vital signs in last 24 hours: Temp:  [97.5 F (36.4 C)-98.2 F (36.8 C)] 98 F (36.7 C) (03/22 0427) Pulse Rate:  [51-79] 56 (03/22 0427) Resp:  [11-18] 16 (03/22 0427) BP: (109-160)/(60-92) 109/60 mmHg (03/22 0427) SpO2:  [98 %-100 %] 98 % (03/22 0427) Last BM Date: 11/10/15 1200 PO  Regular diet 2625 urine Afebrile, VSS No labs   Intake/Output from previous day: 03/21 0701 - 03/22 0700 In: 2450 [P.O.:1200; I.V.:1250] Out: 2640 [Urine:2625; Blood:15] Intake/Output this shift:    General appearance: alert, cooperative and no distress GI: soft sore, steri strips in place.  tolerating diet.  Lab Results:   Recent Labs  11/11/15 0636  WBC 10.0  HGB 14.3  HCT 42.7  PLT 241    BMET  Recent Labs  11/11/15 0636  NA 140  K 4.0  CL 104  CO2 28  GLUCOSE 111*  BUN 19  CREATININE 0.90  CALCIUM 9.6   PT/INR No results for input(s): LABPROT, INR in the last 72 hours.   Recent Labs Lab 11/11/15 0636  AST 17  ALT 17  ALKPHOS 41  BILITOT 0.7  PROT 7.1  ALBUMIN 4.2     Lipase     Component Value Date/Time   LIPASE 31 11/11/2015 0636     Studies/Results: Ct Abdomen Pelvis W Contrast  11/11/2015  CLINICAL DATA:  Acute right lower quadrant pain since Saturday. Associated nausea. EXAM: CT ABDOMEN AND PELVIS WITH CONTRAST TECHNIQUE: Multidetector CT imaging of the abdomen and pelvis was performed using the standard protocol following bolus administration of intravenous contrast. CONTRAST:  100mL ISOVUE-300 IOPAMIDOL (ISOVUE-300) INJECTION 61% COMPARISON:  None. FINDINGS: Lower chest:  No acute findings. Hepatobiliary: Incidental posterior right hepatic subcapsular cyst measures 10 mm, image 19. No biliary dilatation. Patent hepatic and portal veins. No other significant hepatic abnormality. Gallbladder biliary system within normal  limits. Pancreas: No mass, inflammatory changes, or other significant abnormality. Spleen: Within normal limits in size and appearance. Adrenals/Urinary Tract: No masses identified. No evidence of hydronephrosis. Stomach/Bowel: Negative for bowel obstruction, significant dilatation, ileus, or free air. In the right lower quadrant, the appendix is inflamed, fluid distended and contains an appendicolith. Appearance is compatible with a non ruptured acute appendicitis. No fluid collection or abscess. Vascular/Lymphatic: No pathologically enlarged lymph nodes. No evidence of abdominal aortic aneurysm. Reproductive: No mass or other significant abnormality. Other: Trace dependent pelvic free fluid, suspect related to the appendicitis. Musculoskeletal:  No suspicious bone lesions identified. IMPRESSION: Acute nonruptured appendicitis. These results were called by telephone at the time of interpretation on 11/11/2015 at 10:28 am to Dr. Benjiman CoreNATHAN PICKERING , who verbally acknowledged these results. Electronically Signed   By: Judie PetitM.  Shick M.D.   On: 11/11/2015 10:28    Medications: . cefOXitin  1 g Intravenous Q8H  . metronidazole  500 mg Intravenous Q8H   . dextrose 5 % and 0.45 % NaCl with KCl 20 mEq/L     Assessment/Plan Acute appendicitis S/p laparoscopic appendectomy   plan:  Home today        Zanobia Griebel 11/12/2015

## 2015-11-12 NOTE — Discharge Summary (Signed)
Physician Discharge Summary  Patient ID: Ronald DawesJames Boyd MRN: [id number] DOB/AGE: 04/19/1966 50 y.o.  Admit date: 11/11/2015 Discharge date: 11/12/2015  Admission Diagnoses:  Acute appendicitis  Discharge Diagnoses:  Same  Active Problems:   Acute appendicitis   PROCEDURES: laparoscopic appendectomy 11/11/15, Dr. Alean Rinneodd Gerkin  Hospital Course:  Pt reports having RLQ pain that started Saturday 11/08/15, thought it might be constipation. Continued to persist, thru Sunday 3/19, but he played golf and did fine. Monday He still had pain, but went to work, ate no real issues, some nauesa after lunch. Ate supper, and went to bed. Woke up with more significant pain and came to ED this AM. Pain comes and goes, mostly riight lower quadrant, but some going into the mid and left lower abdomen. Ongoing intermittent nausea, vomiting and diarrhea.  Work up in the ED show: afebrile, VSS, labs are normal. CT scan: In the right lower quadrant, the appendix is inflamed, fluid distended and contains an appendicolith. Appearance is compatible with a non ruptured acute appendicitis. No fluid collection or abscess. He was admitted and taken to the OR by Dr. Darnell Levelodd Gerkin.  He did well with surgery and tolerating a diet over night. He did not even require pain meds over night.  He will follow up in the office in 3 weeks.    Condition on d/c:  Improved   CBC Latest Ref Rng 11/11/2015  WBC 4.0 - 10.5 K/uL 10.0  Hemoglobin 13.0 - 17.0 g/dL 40.914.3  Hematocrit 81.139.0 - 52.0 % 42.7  Platelets 150 - 400 K/uL 241   CMP Latest Ref Rng 11/11/2015  Glucose 65 - 99 mg/dL 914(N111(H)  BUN 6 - 20 mg/dL 19  Creatinine 8.290.61 - 5.621.24 mg/dL 1.300.90  Sodium 865135 - 784145 mmol/L 140  Potassium 3.5 - 5.1 mmol/L 4.0  Chloride 101 - 111 mmol/L 104  CO2 22 - 32 mmol/L 28  Calcium 8.9 - 10.3 mg/dL 9.6  Total Protein 6.5 - 8.1 g/dL 7.1  Total Bilirubin 0.3 - 1.2 mg/dL 0.7  Alkaline Phos 38 - 126 U/L 41  AST 15 - 41 U/L 17  ALT 17 - 63 U/L  17   Disposition: 01-Home or Self Care     Medication List    TAKE these medications        acetaminophen 325 MG tablet  Commonly known as:  TYLENOL  Take 2 tablets (650 mg total) by mouth every 6 (six) hours as needed for mild pain (or temp > 100).     ECHINACEA PO  Take 1 capsule by mouth 2 (two) times daily as needed (for cold symptoms).     HYDROcodone-acetaminophen 5-325 MG tablet  Commonly known as:  NORCO/VICODIN  Take 1-2 tablets by mouth every 4 (four) hours as needed for moderate pain.     ibuprofen 200 MG tablet  Commonly known as:  MOTRIN IB  You can take 2-3 tablets every 6 hours as needed for pain.     VITAMIN C PO  Take 1 tablet by mouth 2 (two) times daily as needed (cold symptoms).           Follow-up Information    Follow up with [facility] On 12/03/2015.   Specialty:  General Surgery   Why:  Your appointment is at 9:00 AM, be at the office 30 minutes early for check in.     Contact information:   [address] [place] [address] [phone]  SignedSherrie George 11/12/2015, 3:09 PM

## 2017-01-09 IMAGING — CT CT ABD-PELV W/ CM
2 of 5 series · 16 of 46 positions shown, 18 images · IV contrast (OMNIPAQUE 300)
Comparison: None.

CLINICAL DATA: Acute right lower quadrant pain since [REDACTED].
Associated nausea.

EXAM:
CT ABDOMEN AND PELVIS WITH CONTRAST
TECHNIQUE: Multidetector CT imaging of the abdomen and pelvis was performed
using the standard protocol following bolus administration of
intravenous contrast.
CONTRAST:  100mL H4ZV8D-QFF IOPAMIDOL (H4ZV8D-QFF) INJECTION 61%

[Series 3: abd/pel with · axial · 0.85mm/px · z∈[+1193,+1613]mm · 13 of 94 slices shown, 15 images]
[im 5/94  soft-tissue]
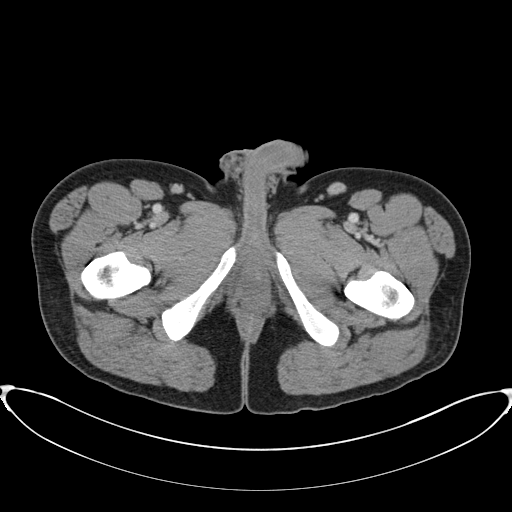
[im 5/94  bone]
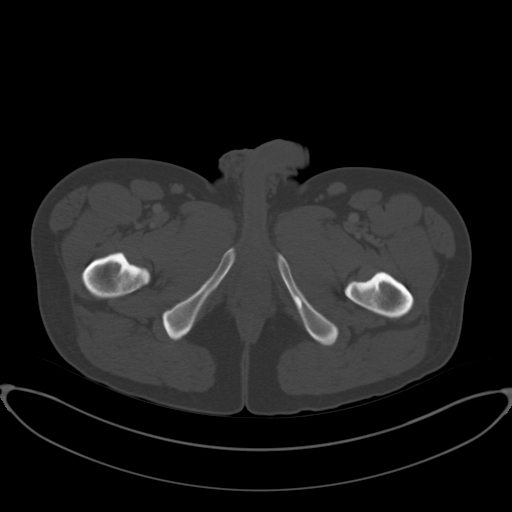
[im 15/94  soft-tissue]
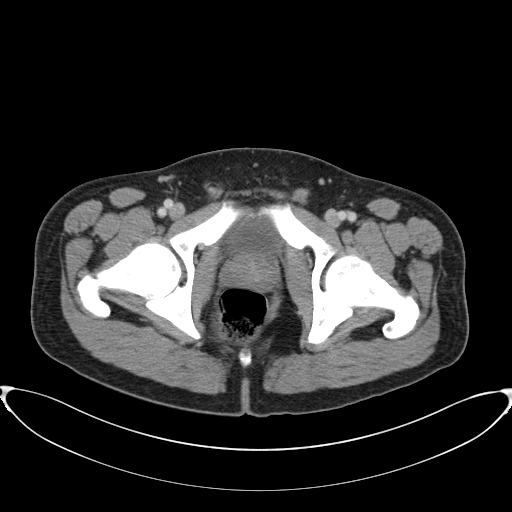
[im 20/94  soft-tissue]
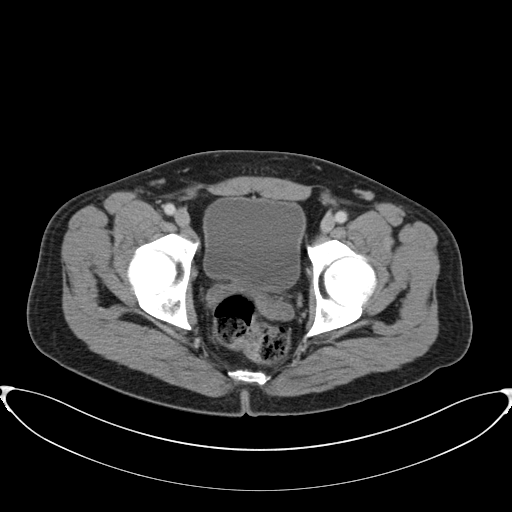
[im 25/94  soft-tissue]
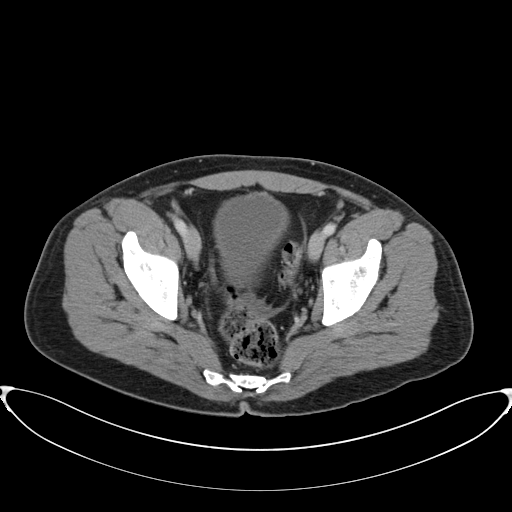
[im 35/94  soft-tissue]
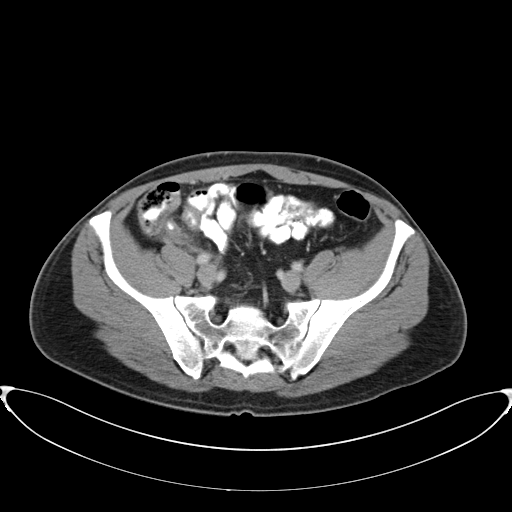
[im 40/94  soft-tissue]
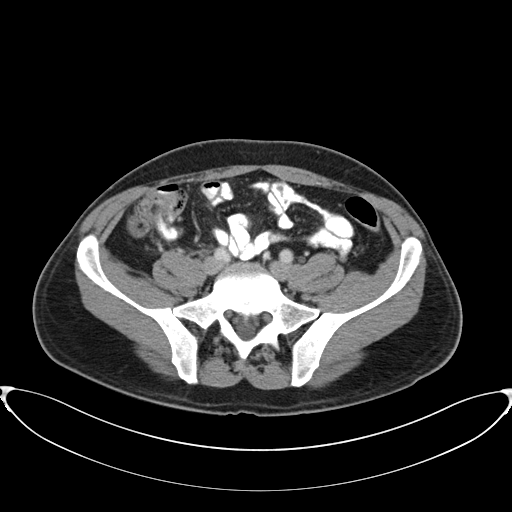
[im 49/94  soft-tissue]
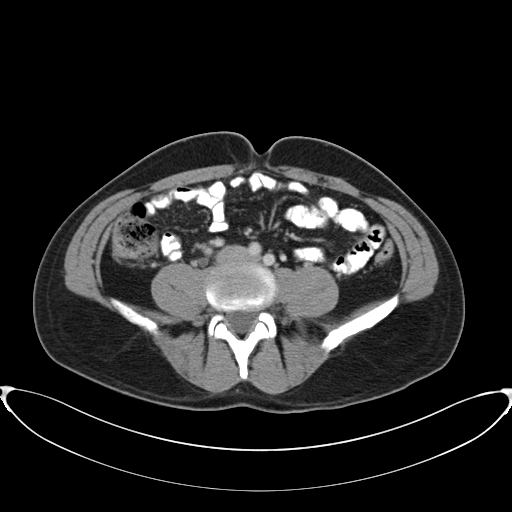
[im 54/94  soft-tissue]
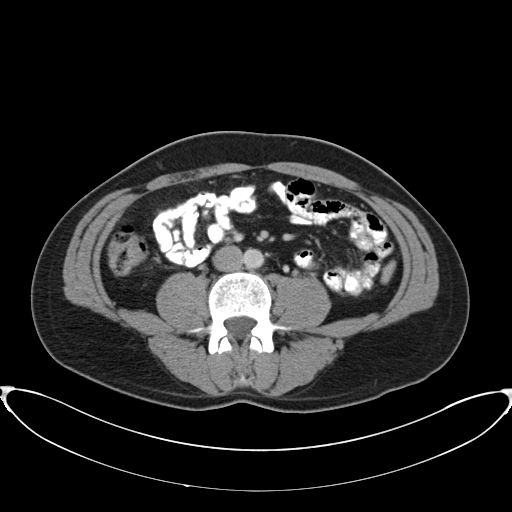
[im 59/94  soft-tissue]
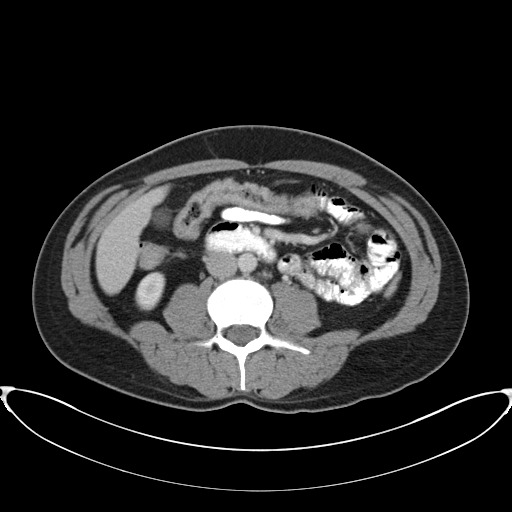
[im 59/94  bone]
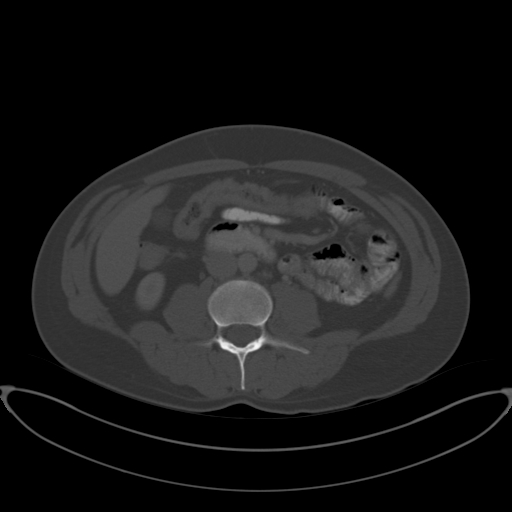
[im 69/94  soft-tissue]
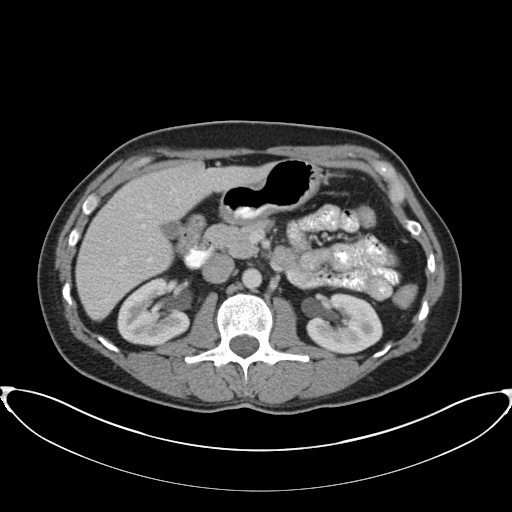
[im 74/94  soft-tissue]
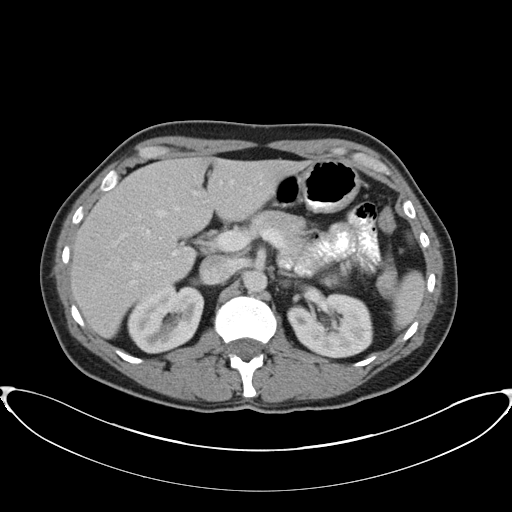
[im 79/94  soft-tissue]
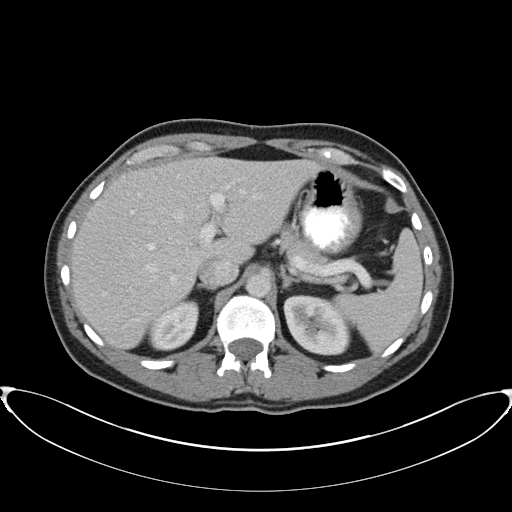
[im 89/94  soft-tissue]
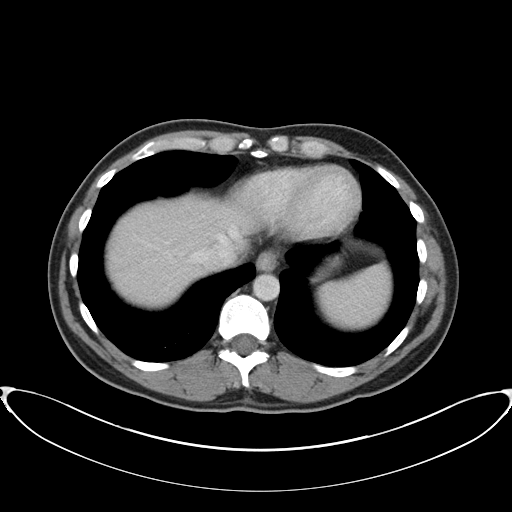

[Series 4: coronal a/|p · coronal · 0.86mm/px · 3 of 75 slices shown]
[im 25/75  soft-tissue]
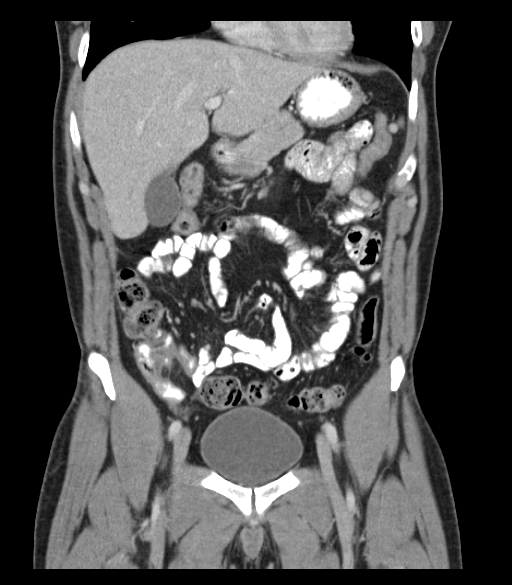
[im 33/75  soft-tissue]
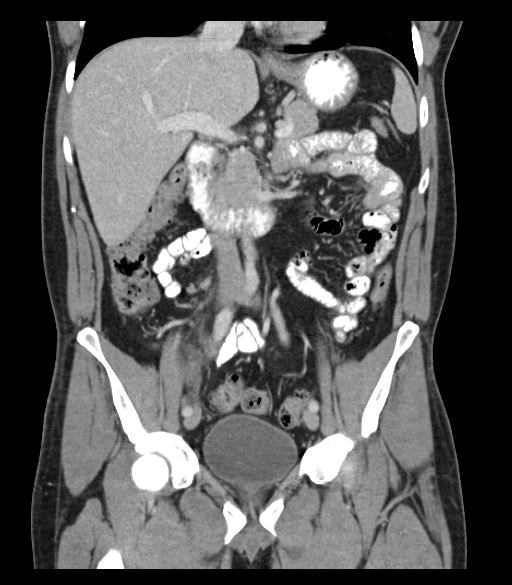
[im 42/75  soft-tissue]
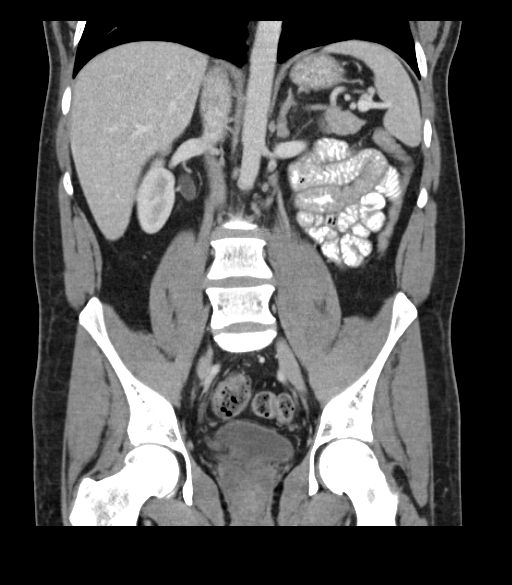

[16 of 46 positions shown; findings below may reference images not displayed]

FINDINGS: Lower chest:  No acute findings.

Hepatobiliary: Incidental posterior right hepatic subcapsular cyst
measures 10 mm, image 19. No biliary dilatation. Patent hepatic and
portal veins. No other significant hepatic abnormality. Gallbladder
biliary system within normal limits.

Pancreas: No mass, inflammatory changes, or other significant
abnormality.

Spleen: Within normal limits in size and appearance.

Adrenals/Urinary Tract: No masses identified. No evidence of
hydronephrosis.

Stomach/Bowel: Negative for bowel obstruction, significant
dilatation, ileus, or free air.

In the right lower quadrant, the appendix is inflamed, fluid
distended and contains an appendicolith. Appearance is compatible
with a non ruptured acute appendicitis. No fluid collection or
abscess.

Vascular/Lymphatic: No pathologically enlarged lymph nodes. No
evidence of abdominal aortic aneurysm.

Reproductive: No mass or other significant abnormality.

Other: Trace dependent pelvic free fluid, suspect related to the
appendicitis.

Musculoskeletal:  No suspicious bone lesions identified.
IMPRESSION: Acute nonruptured appendicitis.

These results were called by telephone at the time of interpretation
on 11/11/2015 at [DATE] to Dr. HORACE JILANI , who verbally
acknowledged these results.

## 2018-03-09 ENCOUNTER — Ambulatory Visit (HOSPITAL_COMMUNITY)
Admission: EM | Admit: 2018-03-09 | Discharge: 2018-03-09 | Disposition: A | Discharge status: Home / Self Care | Payer: BLUE CROSS/BLUE SHIELD | Attending: Family Medicine | Admitting: Family Medicine

## 2018-03-09 ENCOUNTER — Encounter (HOSPITAL_COMMUNITY): Payer: Self-pay | Admitting: Emergency Medicine

## 2018-03-09 DIAGNOSIS — R42 Dizziness and giddiness: Secondary | ICD-10-CM

## 2018-03-09 NOTE — Discharge Instructions (Addendum)
Please perform Epley maneuvers multiple times a day in the morning and evening  Follow up with ENT if symptoms persisting

## 2018-03-09 NOTE — ED Triage Notes (Signed)
Pt here for dizziness worse with quick movement x 4 days with nausea on Monday

## 2018-03-10 NOTE — ED Provider Notes (Signed)
[facility]    CSN: [id number] Arrival date & time: 03/09/18  1524     History   Chief Complaint Chief Complaint  Patient presents with  . Dizziness    appt 1530    HPI Ronald Boyd is a 52 y.o. male no significant past medical history presenting today for evaluation of dizziness.  Patient states that Sunday morning when he woke up he had an episode of approximately 30 seconds where he felt the room spinning.  Monday he also had similar symptoms when he woke up as well as associated nausea and chills.  He attempted to perform a maneuver to get the crystals back in place in his ears.  He states that this was not the Epley maneuver, but another maneuver that he found online.  Symptoms improved Tuesday and Wednesday of this week, but beginning today he had worsening symptoms again.  Denies any vomiting.  Symptoms subside relatively quickly and do not persist.  Denies being sick recently.  Denies history of vertigo.  HPI  History reviewed. No pertinent past medical history.  Patient Active Problem List   Diagnosis Date Noted  . Acute appendicitis 11/11/2015    Past Surgical History:  Procedure Laterality Date  . LAPAROSCOPIC APPENDECTOMY N/A 11/11/2015   Procedure: APPENDECTOMY LAPAROSCOPIC;  Surgeon: Darnell Levelodd Gerkin, MD;  Location: [facility];  Service: General;  Laterality: N/A;       Home Medications    Prior to Admission medications   Medication Sig Start Date End Date Taking? Authorizing Provider  acetaminophen (TYLENOL) 325 MG tablet Take 2 tablets (650 mg total) by mouth every 6 (six) hours as needed for mild pain (or temp > 100). 11/12/15   Sherrie GeorgeJennings, Willard, PA-C  Ascorbic Acid (VITAMIN C PO) Take 1 tablet by mouth 2 (two) times daily as needed (cold symptoms).    [provider]  ECHINACEA PO Take 1 capsule by mouth 2 (two) times daily as needed (for cold symptoms).    [provider]  HYDROcodone-acetaminophen (NORCO/VICODIN) 5-325 MG tablet  Take 1-2 tablets by mouth every 4 (four) hours as needed for moderate pain. Patient not taking: Reported on 03/09/2018 11/12/15   Sherrie GeorgeJennings, Willard, PA-C  ibuprofen (MOTRIN IB) 200 MG tablet You can take 2-3 tablets every 6 hours as needed for pain. 11/12/15   Sherrie GeorgeJennings, Willard, PA-C    Family History History reviewed. No pertinent family history.  Social History Social History   Tobacco Use  . Smoking status: Never Smoker  . Smokeless tobacco: Never Used  Substance Use Topics  . Alcohol use: Yes    Comment: socially  . Drug use: No     Allergies   Patient has no known allergies.   Review of Systems Review of Systems  Constitutional: Negative for fatigue and fever.  HENT: Negative for congestion, sinus pressure and sore throat.   Eyes: Negative for photophobia, pain and visual disturbance.  Respiratory: Negative for cough and shortness of breath.   Cardiovascular: Negative for chest pain.  Gastrointestinal: Negative for abdominal pain, nausea and vomiting.  Genitourinary: Negative for decreased urine volume and hematuria.  Musculoskeletal: Negative for myalgias, neck pain and neck stiffness.  Neurological: Positive for dizziness. Negative for syncope, facial asymmetry, speech difficulty, weakness, light-headedness, numbness and headaches.     Physical Exam Triage Vital Signs ED Triage Vitals [03/09/18 1536]  Enc Vitals Group     BP (!) 146/87     Pulse Rate (!) 57     Resp 18  Temp 98.1 F (36.7 C)     Temp Source Oral     SpO2 98 %     Weight      Height      Head Circumference      Peak Flow      Pain Score      Pain Loc      Pain Edu?      Excl. in GC?    No data found.  Updated Vital Signs BP (!) 146/87 (BP Location: Left Arm)   Pulse (!) 57   Temp 98.1 F (36.7 C) (Oral)   Resp 18   SpO2 98%   Visual Acuity Right Eye Distance:   Left Eye Distance:   Bilateral Distance:    Right Eye Near:   Left Eye Near:    Bilateral Near:      Physical Exam  Constitutional: He is oriented to person, place, and time. He appears well-developed and well-nourished.  HENT:  Head: Normocephalic and atraumatic.  Mouth/Throat: Oropharynx is clear and moist.  Bilateral ears without tenderness to palpation of external auricle, tragus and mastoid, EAC's without erythema or swelling, TM's with good bony landmarks and cone of light. Non erythematous.  Oral mucosa pink and moist, no tonsillar enlargement or exudate. Posterior pharynx patent and nonerythematous, no uvula deviation or swelling. Normal phonation.  Eyes: Pupils are equal, round, and reactive to light. Conjunctivae and EOM are normal.  Neck: Neck supple.  Cardiovascular: Normal rate and regular rhythm.  No murmur heard. Pulmonary/Chest: Effort normal and breath sounds normal. No respiratory distress.  Abdominal: Soft. There is no tenderness.  Musculoskeletal: Normal range of motion. He exhibits no edema.  Neurological: He is alert and oriented to person, place, and time.  Patient A&O x3, cranial nerves II-XII grossly intact, strength at shoulders, hips and knees 5/5, equal bilaterally, biceps and patellar reflex 2+ bilaterally. Normal Finger to nose, RAM and heel to shin. Negative Romberg and Pronator Drift. Gait without abnormality.  Positive Dix-Hallpike with horizontal nystagmus with leftward rotation of head  Skin: Skin is warm and dry.  Psychiatric: He has a normal mood and affect.  Nursing note and vitals reviewed.    UC Treatments / Results  Labs (all labs ordered are listed, but only abnormal results are displayed) Labs Reviewed - No data to display  EKG None  Radiology No results found.  Procedures Procedures (including critical care time)  Medications Ordered in UC Medications - No data to display  Initial Impression / Assessment and Plan / UC Course  I have reviewed the triage vital signs and the nursing notes.  Pertinent labs & imaging results  that were available during my care of the patient were reviewed by me and considered in my medical decision making (see chart for details).     Patient with BPPV, no other neuro deficits, vital signs stable.  Discussed Epley maneuver with patient.  Offered meclizine, patient declined.  Will outpatient follow-up with ENT if symptoms not decreasing in frequency and intensity with performing Epley maneuver.Discussed strict return precautions. Patient verbalized understanding and is agreeable with plan.  Final Clinical Impressions(s) / UC Diagnoses   Final diagnoses:  Vertigo     Discharge Instructions     Please perform Epley maneuvers multiple times a day in the morning and evening  Follow up with ENT if symptoms persisting    ED Prescriptions    None     Controlled Substance Prescriptions [place] Controlled Substance Registry consulted? Not  [address]   Lew Dawes, [place] 03/10/18 [phone]

## 2019-01-18 DIAGNOSIS — Z0000 Encounter for general adult medical examination without abnormal findings: Secondary | ICD-10-CM | POA: Diagnosis not present

## 2019-01-25 DIAGNOSIS — Z23 Encounter for immunization: Secondary | ICD-10-CM | POA: Diagnosis not present

## 2019-01-25 DIAGNOSIS — Z13220 Encounter for screening for lipoid disorders: Secondary | ICD-10-CM | POA: Diagnosis not present

## 2019-01-25 DIAGNOSIS — Z0000 Encounter for general adult medical examination without abnormal findings: Secondary | ICD-10-CM | POA: Diagnosis not present

## 2019-02-01 DIAGNOSIS — K219 Gastro-esophageal reflux disease without esophagitis: Secondary | ICD-10-CM | POA: Diagnosis not present

## 2019-02-01 DIAGNOSIS — Z1211 Encounter for screening for malignant neoplasm of colon: Secondary | ICD-10-CM | POA: Diagnosis not present

## 2019-03-19 DIAGNOSIS — D1801 Hemangioma of skin and subcutaneous tissue: Secondary | ICD-10-CM | POA: Diagnosis not present

## 2019-03-19 DIAGNOSIS — D485 Neoplasm of uncertain behavior of skin: Secondary | ICD-10-CM | POA: Diagnosis not present

## 2019-03-19 DIAGNOSIS — D2271 Melanocytic nevi of right lower limb, including hip: Secondary | ICD-10-CM | POA: Diagnosis not present

## 2019-03-19 DIAGNOSIS — C44311 Basal cell carcinoma of skin of nose: Secondary | ICD-10-CM | POA: Diagnosis not present

## 2019-03-19 DIAGNOSIS — L578 Other skin changes due to chronic exposure to nonionizing radiation: Secondary | ICD-10-CM | POA: Diagnosis not present

## 2019-03-19 DIAGNOSIS — L821 Other seborrheic keratosis: Secondary | ICD-10-CM | POA: Diagnosis not present

## 2019-03-21 DIAGNOSIS — D122 Benign neoplasm of ascending colon: Secondary | ICD-10-CM | POA: Diagnosis not present

## 2019-03-21 DIAGNOSIS — D125 Benign neoplasm of sigmoid colon: Secondary | ICD-10-CM | POA: Diagnosis not present

## 2019-03-21 DIAGNOSIS — K635 Polyp of colon: Secondary | ICD-10-CM | POA: Diagnosis not present

## 2019-03-21 DIAGNOSIS — Z1211 Encounter for screening for malignant neoplasm of colon: Secondary | ICD-10-CM | POA: Diagnosis not present

## 2019-03-27 DIAGNOSIS — C44311 Basal cell carcinoma of skin of nose: Secondary | ICD-10-CM | POA: Diagnosis not present

## 2019-06-13 DIAGNOSIS — Z23 Encounter for immunization: Secondary | ICD-10-CM | POA: Diagnosis not present

## 2019-07-27 ENCOUNTER — Other Ambulatory Visit: Payer: Self-pay

## 2019-07-27 DIAGNOSIS — Z20822 Contact with and (suspected) exposure to covid-19: Secondary | ICD-10-CM

## 2019-07-30 LAB — NOVEL CORONAVIRUS, NAA: SARS-CoV-2, NAA: NOT DETECTED

## 2019-09-28 DIAGNOSIS — Z23 Encounter for immunization: Secondary | ICD-10-CM | POA: Diagnosis not present

## 2019-12-14 DIAGNOSIS — Z23 Encounter for immunization: Secondary | ICD-10-CM | POA: Diagnosis not present

## 2020-03-05 DIAGNOSIS — Z20822 Contact with and (suspected) exposure to covid-19: Secondary | ICD-10-CM | POA: Diagnosis not present

## 2020-03-06 DIAGNOSIS — Z20822 Contact with and (suspected) exposure to covid-19: Secondary | ICD-10-CM | POA: Diagnosis not present

## 2020-03-06 DIAGNOSIS — Z03818 Encounter for observation for suspected exposure to other biological agents ruled out: Secondary | ICD-10-CM | POA: Diagnosis not present

## 2020-03-20 DIAGNOSIS — L570 Actinic keratosis: Secondary | ICD-10-CM | POA: Diagnosis not present

## 2020-03-20 DIAGNOSIS — D2272 Melanocytic nevi of left lower limb, including hip: Secondary | ICD-10-CM | POA: Diagnosis not present

## 2020-03-20 DIAGNOSIS — D1801 Hemangioma of skin and subcutaneous tissue: Secondary | ICD-10-CM | POA: Diagnosis not present

## 2020-03-20 DIAGNOSIS — Z85828 Personal history of other malignant neoplasm of skin: Secondary | ICD-10-CM | POA: Diagnosis not present

## 2020-03-20 DIAGNOSIS — D0461 Carcinoma in situ of skin of right upper limb, including shoulder: Secondary | ICD-10-CM | POA: Diagnosis not present

## 2020-03-20 DIAGNOSIS — L814 Other melanin hyperpigmentation: Secondary | ICD-10-CM | POA: Diagnosis not present

## 2020-03-20 DIAGNOSIS — D485 Neoplasm of uncertain behavior of skin: Secondary | ICD-10-CM | POA: Diagnosis not present

## 2020-07-02 DIAGNOSIS — Z23 Encounter for immunization: Secondary | ICD-10-CM | POA: Diagnosis not present

## 2020-07-21 ENCOUNTER — Ambulatory Visit: Payer: BC Managed Care – PPO | Attending: Internal Medicine

## 2020-07-21 DIAGNOSIS — Z23 Encounter for immunization: Secondary | ICD-10-CM

## 2020-07-21 NOTE — Progress Notes (Signed)
   Covid-19 Vaccination Clinic  Name:  Ronald Boyd    MRN: [id number] DOB: 09-02-1965  07/21/2020  Mr. Ronald Boyd was observed post Covid-19 immunization for 15 minutes without incident. He was provided with Vaccine Information Sheet and instruction to access the V-Safe system.   Mr. Ronald Boyd was instructed to call 911 with any severe reactions post vaccine: Marland Kitchen Difficulty breathing  . Swelling of face and throat  . A fast heartbeat  . A bad rash all over body  . Dizziness and weakness   Immunizations Administered    Name Date Dose VIS Date Route   Pfizer COVID-19 Vaccine 07/21/2020  1:20 PM 0.3 mL 06/11/2020 Intramuscular   Manufacturer: [employer], [employer]   Lot: [id number]   NDC: [address]-1

## 2020-08-18 ENCOUNTER — Other Ambulatory Visit: Payer: BC Managed Care – PPO

## 2020-08-18 DIAGNOSIS — Z20822 Contact with and (suspected) exposure to covid-19: Secondary | ICD-10-CM

## 2020-08-19 LAB — SARS-COV-2, NAA 2 DAY TAT

## 2020-08-19 LAB — NOVEL CORONAVIRUS, NAA: SARS-CoV-2, NAA: NOT DETECTED

## 2020-08-26 ENCOUNTER — Other Ambulatory Visit: Payer: BC Managed Care – PPO

## 2020-08-26 DIAGNOSIS — Z20822 Contact with and (suspected) exposure to covid-19: Secondary | ICD-10-CM

## 2020-08-28 LAB — NOVEL CORONAVIRUS, NAA: SARS-CoV-2, NAA: DETECTED — AB

## 2020-08-28 LAB — SARS-COV-2, NAA 2 DAY TAT

## 2020-11-12 DIAGNOSIS — J069 Acute upper respiratory infection, unspecified: Secondary | ICD-10-CM | POA: Diagnosis not present

## 2021-03-25 DIAGNOSIS — D0462 Carcinoma in situ of skin of left upper limb, including shoulder: Secondary | ICD-10-CM | POA: Diagnosis not present

## 2021-03-25 DIAGNOSIS — L570 Actinic keratosis: Secondary | ICD-10-CM | POA: Diagnosis not present

## 2021-03-25 DIAGNOSIS — Z85828 Personal history of other malignant neoplasm of skin: Secondary | ICD-10-CM | POA: Diagnosis not present

## 2021-03-25 DIAGNOSIS — C44622 Squamous cell carcinoma of skin of right upper limb, including shoulder: Secondary | ICD-10-CM | POA: Diagnosis not present

## 2021-03-25 DIAGNOSIS — D2261 Melanocytic nevi of right upper limb, including shoulder: Secondary | ICD-10-CM | POA: Diagnosis not present

## 2021-03-25 DIAGNOSIS — L814 Other melanin hyperpigmentation: Secondary | ICD-10-CM | POA: Diagnosis not present

## 2021-03-25 DIAGNOSIS — L821 Other seborrheic keratosis: Secondary | ICD-10-CM | POA: Diagnosis not present

## 2021-04-30 DIAGNOSIS — Z23 Encounter for immunization: Secondary | ICD-10-CM | POA: Diagnosis not present

## 2021-05-04 DIAGNOSIS — R051 Acute cough: Secondary | ICD-10-CM | POA: Diagnosis not present

## 2021-05-04 DIAGNOSIS — J209 Acute bronchitis, unspecified: Secondary | ICD-10-CM | POA: Diagnosis not present

## 2021-05-07 ENCOUNTER — Encounter (HOSPITAL_COMMUNITY): Payer: Self-pay

## 2021-05-07 ENCOUNTER — Emergency Department (HOSPITAL_COMMUNITY)
Admission: EM | Admit: 2021-05-07 | Discharge: 2021-05-07 | Disposition: A | Discharge status: Home / Self Care | Payer: BC Managed Care – PPO | Attending: Emergency Medicine | Admitting: Emergency Medicine

## 2021-05-07 ENCOUNTER — Other Ambulatory Visit: Payer: Self-pay

## 2021-05-07 DIAGNOSIS — Z23 Encounter for immunization: Secondary | ICD-10-CM | POA: Diagnosis not present

## 2021-05-07 DIAGNOSIS — Y93G1 Activity, food preparation and clean up: Secondary | ICD-10-CM | POA: Diagnosis not present

## 2021-05-07 DIAGNOSIS — W268XXA Contact with other sharp object(s), not elsewhere classified, initial encounter: Secondary | ICD-10-CM | POA: Diagnosis not present

## 2021-05-07 DIAGNOSIS — S6991XA Unspecified injury of right wrist, hand and finger(s), initial encounter: Secondary | ICD-10-CM | POA: Diagnosis not present

## 2021-05-07 DIAGNOSIS — S61411A Laceration without foreign body of right hand, initial encounter: Secondary | ICD-10-CM | POA: Diagnosis not present

## 2021-05-07 MED ORDER — LIDOCAINE HCL 2 % IJ SOLN
10.0000 mL | Freq: Once | INTRAMUSCULAR | Status: AC
  Administered 2021-05-07: 200 mg

## 2021-05-07 MED ORDER — TETANUS-DIPHTH-ACELL PERTUSSIS 5-2.5-18.5 LF-MCG/0.5 IM SUSY
0.5000 mL | PREFILLED_SYRINGE | Freq: Once | INTRAMUSCULAR | Status: AC
  Administered 2021-05-07: 0.5 mL via INTRAMUSCULAR
  Filled 2021-05-07: qty 0.5

## 2021-05-07 NOTE — ED Provider Notes (Signed)
[facility]-EMERGENCY DEPT Provider Note   CSN: [id number] Arrival date & time: 05/07/21  2153     History Chief Complaint  Patient presents with   Extremity Laceration    Ronald Boyd is a 55 y.o. male here with right hand laceration.  Was washing dishes and under the dishes broke and cut his right hand.  Patient states that he had a laceration over the right second finger that was bleeding.  Patient did not remember when his last tetanus shot was.  The history is provided by the patient.      History reviewed. No pertinent past medical history.  Patient Active Problem List   Diagnosis Date Noted   Acute appendicitis 11/11/2015    Past Surgical History:  Procedure Laterality Date   LAPAROSCOPIC APPENDECTOMY N/A 11/11/2015   Procedure: APPENDECTOMY LAPAROSCOPIC;  Surgeon: Darnell Level, MD;  Location: [facility] ORS;  Service: General;  Laterality: N/A;       History reviewed. No pertinent family history.  Social History   Tobacco Use   Smoking status: Never   Smokeless tobacco: Never  Substance Use Topics   Alcohol use: Yes    Comment: socially   Drug use: No    Home Medications Prior to Admission medications   Medication Sig Start Date End Date Taking? Authorizing Provider  acetaminophen (TYLENOL) 325 MG tablet Take 2 tablets (650 mg total) by mouth every 6 (six) hours as needed for mild pain (or temp > 100). 11/12/15   Sherrie George, PA-C  Ascorbic Acid (VITAMIN C PO) Take 1 tablet by mouth 2 (two) times daily as needed (cold symptoms).    [provider]  ECHINACEA PO Take 1 capsule by mouth 2 (two) times daily as needed (for cold symptoms).    [provider]  HYDROcodone-acetaminophen (NORCO/VICODIN) 5-325 MG tablet Take 1-2 tablets by mouth every 4 (four) hours as needed for moderate pain. Patient not taking: Reported on 03/09/2018 11/12/15   Sherrie George, PA-C  ibuprofen (MOTRIN IB) 200 MG tablet You can take 2-3  tablets every 6 hours as needed for pain. 11/12/15   Sherrie George, PA-C    Allergies    Patient has no known allergies.  Review of Systems   Review of Systems  Skin:  Positive for wound.  All other systems reviewed and are negative.  Physical Exam Updated Vital Signs BP (!) 151/97 (BP Location: Right Arm)   Pulse 69   Temp 98.1 F (36.7 C) (Oral)   Resp 18   Ht 6\' 5"  (1.956 m)   Wt 90.7 kg   SpO2 99%   BMI 23.72 kg/m   Physical Exam Vitals and nursing note reviewed.  Constitutional:      Appearance: Normal appearance.  HENT:     Head: Normocephalic.     Mouth/Throat:     Mouth: Mucous membranes are moist.  Eyes:     Extraocular Movements: Extraocular movements intact.     Pupils: Pupils are equal, round, and reactive to light.  Cardiovascular:     Rate and Rhythm: Normal rate.     Pulses: Normal pulses.  Pulmonary:     Effort: Pulmonary effort is normal.  Abdominal:     General: Abdomen is flat.  Musculoskeletal:     Cervical back: Normal range of motion.  Skin:    General: Skin is warm.     Capillary Refill: Capillary refill takes less than 2 seconds.     Comments: 3 cm laceration over R  2nd MTP joint. Well approximated. No obvious tendons exposed.   Neurological:     General: No focal deficit present.     Mental Status: He is alert and oriented to person, place, and time.  Psychiatric:        Mood and Affect: Mood normal.        Behavior: Behavior normal.    ED Results / Procedures / Treatments   Labs (all labs ordered are listed, but only abnormal results are displayed) Labs Reviewed - No data to display  EKG None  Radiology No results found.  Procedures Procedures   LACERATION REPAIR Performed by: Richardean Canal Authorized by: Richardean Canal Consent: Verbal consent obtained. Risks and benefits: risks, benefits and alternatives were discussed Consent given by: patient Patient identity confirmed: provided demographic data Prepped and Draped  in normal sterile fashion Wound explored  Laceration Location: R hand   Laceration Length: 3 cm  No Foreign Bodies seen or palpated  Anesthesia: local infiltration  Local anesthetic: lidocaine 2 % no epinephrine  Anesthetic total: 5 ml  Irrigation method: syringe Amount of cleaning: standard  Skin closure: 4-0 ethilon   Number of sutures: 3  Technique: simple interrupted   Patient tolerance: Patient tolerated the procedure well with no immediate complications.   Medications Ordered in ED Medications  Tdap (BOOSTRIX) injection 0.5 mL (has no administration in time range)  lidocaine (XYLOCAINE) 2 % (with pres) injection 200 mg (200 mg Infiltration Given 05/07/21 2257)    ED Course  I have reviewed the triage vital signs and the nursing notes.  Pertinent labs & imaging results that were available during my care of the patient were reviewed by me and considered in my medical decision making (see chart for details).    MDM Rules/Calculators/A&P                          Ronald Boyd is a 55 y.o. male here with R hand laceration.  He has a small right hand laceration that is clean and well approximated.  Neurovascular intact in the right hand.  I placed 3 stitches.  Patient's tetanus is updated.  Suture removal in a week   Final Clinical Impression(s) / ED Diagnoses Final diagnoses:  None    Rx / DC Orders ED Discharge Orders     None        Charlynne Pander, MD 05/07/21 2306

## 2021-05-07 NOTE — ED Triage Notes (Signed)
Pt states he was washing dishes tonight and cut his hand on a dish. Pt has lac to right hand.

## 2021-05-07 NOTE — ED Notes (Signed)
An After Visit Summary was printed and given to the patient. Discharge instructions given and no further questions at this time.  

## 2021-05-07 NOTE — Discharge Instructions (Signed)
You had stitches placed that need to be removed in a week.  Take Tylenol or Motrin for pain.  If the bandage gets wet, you can replace it.  Return to ER if you have uncontrolled bleeding, severe pain, worse redness of the wound, fever.

## 2022-03-31 DIAGNOSIS — D2272 Melanocytic nevi of left lower limb, including hip: Secondary | ICD-10-CM | POA: Diagnosis not present

## 2022-03-31 DIAGNOSIS — L570 Actinic keratosis: Secondary | ICD-10-CM | POA: Diagnosis not present

## 2022-03-31 DIAGNOSIS — C44319 Basal cell carcinoma of skin of other parts of face: Secondary | ICD-10-CM | POA: Diagnosis not present

## 2022-03-31 DIAGNOSIS — D225 Melanocytic nevi of trunk: Secondary | ICD-10-CM | POA: Diagnosis not present

## 2022-03-31 DIAGNOSIS — D2271 Melanocytic nevi of right lower limb, including hip: Secondary | ICD-10-CM | POA: Diagnosis not present

## 2022-03-31 DIAGNOSIS — Z85828 Personal history of other malignant neoplasm of skin: Secondary | ICD-10-CM | POA: Diagnosis not present

## 2022-05-03 DIAGNOSIS — C44319 Basal cell carcinoma of skin of other parts of face: Secondary | ICD-10-CM | POA: Diagnosis not present

## 2022-10-14 DIAGNOSIS — Z7189 Other specified counseling: Secondary | ICD-10-CM | POA: Diagnosis not present

## 2023-04-01 DIAGNOSIS — D2271 Melanocytic nevi of right lower limb, including hip: Secondary | ICD-10-CM | POA: Diagnosis not present

## 2023-04-01 DIAGNOSIS — L814 Other melanin hyperpigmentation: Secondary | ICD-10-CM | POA: Diagnosis not present

## 2023-04-01 DIAGNOSIS — D225 Melanocytic nevi of trunk: Secondary | ICD-10-CM | POA: Diagnosis not present

## 2023-04-01 DIAGNOSIS — Z85828 Personal history of other malignant neoplasm of skin: Secondary | ICD-10-CM | POA: Diagnosis not present

## 2023-04-01 DIAGNOSIS — L570 Actinic keratosis: Secondary | ICD-10-CM | POA: Diagnosis not present
